# Patient Record
Sex: Female | Born: 1990 | Race: White | Hispanic: No | Marital: Single | Smoking: Never smoker
Health system: Southern US, Community
[De-identification: ages and names within clinical notes are randomized; demographics above are authoritative.]

## PROBLEM LIST (undated history)

## (undated) DIAGNOSIS — F32A Depression, unspecified: Secondary | ICD-10-CM

## (undated) DIAGNOSIS — F419 Anxiety disorder, unspecified: Secondary | ICD-10-CM

## (undated) DIAGNOSIS — F329 Major depressive disorder, single episode, unspecified: Secondary | ICD-10-CM

## (undated) HISTORY — DX: Depression, unspecified: F32.A

## (undated) HISTORY — DX: Anxiety disorder, unspecified: F41.9

## (undated) HISTORY — DX: Major depressive disorder, single episode, unspecified: F32.9

---
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
## 2015-10-10 DIAGNOSIS — R87610 Atypical squamous cells of undetermined significance on cytologic smear of cervix (ASC-US): Secondary | ICD-10-CM | POA: Insufficient documentation

## 2015-10-31 DIAGNOSIS — F320 Major depressive disorder, single episode, mild: Secondary | ICD-10-CM | POA: Insufficient documentation

## 2015-12-07 DIAGNOSIS — F329 Major depressive disorder, single episode, unspecified: Secondary | ICD-10-CM | POA: Insufficient documentation

## 2015-12-07 DIAGNOSIS — F419 Anxiety disorder, unspecified: Secondary | ICD-10-CM | POA: Insufficient documentation

## 2017-01-15 ENCOUNTER — Ambulatory Visit (HOSPITAL_COMMUNITY): Payer: Self-pay | Admitting: Psychiatry

## 2017-02-25 ENCOUNTER — Encounter (HOSPITAL_COMMUNITY): Payer: Self-pay | Admitting: Psychiatry

## 2017-02-25 ENCOUNTER — Encounter (INDEPENDENT_AMBULATORY_CARE_PROVIDER_SITE_OTHER): Payer: Self-pay

## 2017-02-25 ENCOUNTER — Ambulatory Visit (INDEPENDENT_AMBULATORY_CARE_PROVIDER_SITE_OTHER): Payer: 59 | Admitting: Psychiatry

## 2017-02-25 VITALS — BP 102/64 | HR 67 | Ht 61.5 in | Wt 122.6 lb

## 2017-02-25 DIAGNOSIS — F3341 Major depressive disorder, recurrent, in partial remission: Secondary | ICD-10-CM | POA: Diagnosis not present

## 2017-02-25 DIAGNOSIS — G4700 Insomnia, unspecified: Secondary | ICD-10-CM

## 2017-02-25 DIAGNOSIS — F1210 Cannabis abuse, uncomplicated: Secondary | ICD-10-CM | POA: Diagnosis not present

## 2017-02-25 MED ORDER — BUPROPION HCL ER (SR) 100 MG PO TB12: 100.0000 mg | ORAL_TABLET | Freq: Every day | ORAL | 1 refills | Status: AC

## 2017-02-25 NOTE — Progress Notes (Signed)
Psychiatric Initial Adult Assessment   Patient Identification: Katie Stevens MRN:  [id number] Date of Evaluation:  02/25/2017 Referral Source: self, pcp Chief Complaint:  depression, anxiety Visit Diagnosis:    ICD-10-CM   1. Cannabis abuse F12.10   2. Recurrent major depressive disorder, in partial remission (HCC) F33.41     History of Present Illness:  Katie Stevens presents today for psychiatric intake assessment. She is presently on Prozac and BuSpar as prescribed by her primary care physician.  She reports that she is struggle with depression symptoms since she was a teenager in high school, and has struggled with chronic issues of self-esteem. She reports that this is been worse over the past few years over the time that she has been trying to figure out what to do with her life. She has been working at [employer] for several years. She has not attended college, but is interested to attend college. Reports that she's had significant difficulties with building up the motivation and energy to apply for college, and reports that she has never had any support or information on how to go about applying for college, so she is overwhelmed by the prospects of this.  She reports that she lives with her sister who did attend college, and she is agreeable to participating in therapy to have support in terms of making goals for herself. She reports that she had been off of Prozac for a couple weeks when she ran out and noticed that her mood was more irritable and depressed. She is now back on the regular Prozac 40 mg daily and BuSpar 10 mg twice daily. Both medications were added very close to one another when she initially started them a year ago, so it's difficult to say if BuSpar has added any benefit.    Spent time discussing her use of marijuana. She reports that she has been interested in using marijuana since high school, and has used it a few times, but then decided about 2 months  ago that she wanted to use it more regularly.  She is fairly vague about why she wants to use regularly, and reports that it helps her settle down at the end of the day, and it helps her sleep well. She also reports that it's just fun, and she wanted to do it for the experience of using on a more regular basis. This is caused significant fallout and conflict with her boyfriend who does not support her using marijuana in any capacity. She reports that they've been together since high school, and have had a lot of relationship troubles lately. There has not been any violence in the relationship, but there has been emotional cheating on the part of the boyfriend in response to the patient increasing her marijuana use.  Discussing medications, I recommended that we taper and discontinue BuSpar. I suggested we start bupropion given her complaints of fatigue, low energy, trouble thinking. I also educated the patient that I suspect marijuana is contributing to her difficulty with memory, slow thinking, procrastination. She mentions concerns about ADHD, and I expressed to her that it's difficult to make a diagnosis of ADHD while someone is actively abusing marijuana on a daily basis. We agreed to start Wellbutrin, she denies any history of seizures. I reviewed the risks and benefits with her and agreed to follow-up in 6 weeks.   She denies any acute safety issues, cutting, or other drug use.   NCCSD: No data in the past 1 year  Associated  Signs/Symptoms: Depression Symptoms:  depressed mood, anhedonia, difficulty concentrating, impaired memory, anxiety, (Hypo) Manic Symptoms:  Irritable Mood, Anxiety Symptoms:  Social Anxiety, Psychotic Symptoms:  none PTSD Symptoms: Negative  Past Psychiatric History: No prior psychiatric treatment, PCP manages medications  Previous Psychotropic Medications: Yes   Substance Abuse History in the last 12 months:  Yes.    Consequences of Substance Abuse: Worsening  anxiety during the day, memory difficulties, concentration difficulties, relationship problems with BF  Past Medical History:  Past Medical History:  Diagnosis Date  . Anxiety   . Depression    History reviewed. No pertinent surgical history.  Family Psychiatric History: None  Family History: History reviewed. No pertinent family history.  Social History:   Social History   Social History  . Marital status: Single    Spouse name: N/A  . Number of children: N/A  . Years of education: N/A   Social History Main Topics  . Smoking status: Never Smoker  . Smokeless tobacco: Never Used  . Alcohol use Yes     Comment: occasional  . Drug use: No  . Sexual activity: Yes    Birth control/ protection: Pill   Other Topics Concern  . None   Social History Narrative  . None    Additional Social History: Lives with her sister and sister's fiance  Allergies:  Allergies not on file  Metabolic Disorder Labs: No results found for: HGBA1C, MPG No results found for: PROLACTIN No results found for: CHOL, TRIG, HDL, CHOLHDL, VLDL, LDLCALC   Current Medications: Current Outpatient Prescriptions  Medication Sig Dispense Refill  . busPIRone (BUSPAR) 10 MG tablet Take 10 mg by mouth.    Marland Kitchen. FLUoxetine (PROZAC) 40 MG capsule Take 40 mg by mouth.    . norgestimate-ethinyl estradiol (SPRINTEC 28) 0.25-35 MG-MCG tablet TAKE ONE TABLET BY MOUTH  DAILY    . buPROPion (WELLBUTRIN SR) 100 MG 12 hr tablet Take 1 tablet (100 mg total) by mouth daily. Take 1 tablet in the morning, and increase to twice a day as tolerated 60 tablet 1   No current facility-administered medications for this visit.     Neurologic: Headache: Negative Seizure: Negative Paresthesias:Negative  Musculoskeletal: Strength & Muscle Tone: within normal limits Gait & Station: normal Patient leans: N/A  Psychiatric Specialty Exam: Review of Systems  Constitutional: Negative.   HENT: Negative.   Respiratory:  Negative.   Cardiovascular: Negative.   Gastrointestinal: Negative.   Musculoskeletal: Negative.   Neurological: Negative.   Psychiatric/Behavioral: Positive for depression. The patient is nervous/anxious and has insomnia.     Blood pressure 102/64, pulse 67, height 5' 1.5" (1.562 m), weight 122 lb 9.6 oz (55.6 kg).Body mass index is 22.79 kg/m.  General Appearance: Casual and Fairly Groomed  Eye Contact:  Good  Speech:  Clear and Coherent  Volume:  Normal  Mood:  Dysphoric  Affect:  Appropriate and Congruent  Thought Process:  Goal Directed  Orientation:  Full (Time, Place, and Person)  Thought Content:  Logical  Suicidal Thoughts:  No  Homicidal Thoughts:  No  Memory:  Immediate;   Fair  Judgement:  Fair  Insight:  Shallow  Psychomotor Activity:  Normal  Concentration:  Attention Span: Fair  Recall:  FiservFair  Fund of Knowledge:Fair  Language: Fair  Akathisia:  Negative  Handed:  Right  AIMS (if indicated):  0  Assets:  Communication Skills Desire for Improvement Financial Resources/Insurance Housing Intimacy Transportation Vocational/Educational  ADL's:  Intact  Cognition: WNL  Sleep:  6-8 hours    Treatment Plan Summary: Fajr Fife is a 26 year old female with a history of major depressive disorder, diagnosed approximately one year ago, and a recent diagnosis of cannabis use disorder, with more regular use of cannabis over the past 2-3 months. She presents today for psychiatric medication management. She has some concerns about attention and cognition, but I suspect this is largely due to her increased use of marijuana. Spent time discussing some pharmacologic strategies for addressing her attention and focus and augmenting her mood symptoms of depression and fatigue. I'd like to see her off BuSpar, and we will begin augmentation with Wellbutrin SR 100 mg daily. I also recommended that she taper and discontinue her cannabis use and initiate therapy in this  office.  1. Cannabis abuse   2. Recurrent major depressive disorder, in partial remission (HCC)     Follow-up in 6-8 weeks with writer Initiate therapy in clinic for cannabis use and depression Continue Prozac 40 mg daily BuSpar 10 mg 1 week then discontinue Initiate Wellbutrin 100 mg SR once daily, increase to twice daily as tolerated    Burnard Leigh, MD 8/6/201811:16 AM

## 2017-04-23 ENCOUNTER — Other Ambulatory Visit (HOSPITAL_COMMUNITY): Payer: Self-pay

## 2017-04-23 MED ORDER — FLUOXETINE HCL 40 MG PO CAPS: 40.0000 mg | ORAL_CAPSULE | Freq: Every day | ORAL | 0 refills | Status: DC

## 2017-06-11 ENCOUNTER — Ambulatory Visit (HOSPITAL_COMMUNITY): Payer: Self-pay | Admitting: Psychiatry

## 2017-07-22 ENCOUNTER — Other Ambulatory Visit (HOSPITAL_COMMUNITY): Payer: Self-pay | Admitting: Psychiatry
